# Patient Record
Sex: Female | Born: 1985 | Hispanic: Yes | Marital: Single | Smoking: Never smoker
Health system: Southern US, Community
[De-identification: ages and names within clinical notes are randomized; demographics above are authoritative.]

## PROBLEM LIST (undated history)

## (undated) DIAGNOSIS — R030 Elevated blood-pressure reading, without diagnosis of hypertension: Secondary | ICD-10-CM

## (undated) HISTORY — DX: Elevated blood-pressure reading, without diagnosis of hypertension: R03.0

---

## 2016-02-16 ENCOUNTER — Other Ambulatory Visit: Payer: Self-pay

## 2016-05-28 LAB — OB RESULTS CONSOLE ANTIBODY SCREEN: ANTIBODY SCREEN: NEGATIVE

## 2016-05-28 LAB — OB RESULTS CONSOLE HGB/HCT, BLOOD
HEMATOCRIT: 41 %
Hemoglobin: 13.4 g/dL

## 2016-05-28 LAB — OB RESULTS CONSOLE ABO/RH: RH Type: POSITIVE

## 2016-05-28 LAB — OB RESULTS CONSOLE GC/CHLAMYDIA
Chlamydia: NEGATIVE
GC PROBE AMP, GENITAL: NEGATIVE

## 2016-05-28 LAB — OB RESULTS CONSOLE RPR: RPR: NONREACTIVE

## 2016-05-28 LAB — OB RESULTS CONSOLE HEPATITIS B SURFACE ANTIGEN: Hepatitis B Surface Ag: NEGATIVE

## 2016-05-28 LAB — OB RESULTS CONSOLE PLATELET COUNT: PLATELETS: 290 10*3/uL

## 2016-05-28 LAB — OB RESULTS CONSOLE VARICELLA ZOSTER ANTIBODY, IGG: VARICELLA IGG: IMMUNE

## 2016-05-28 LAB — OB RESULTS CONSOLE HIV ANTIBODY (ROUTINE TESTING): HIV: NONREACTIVE

## 2016-05-28 LAB — OB RESULTS CONSOLE RUBELLA ANTIBODY, IGM: RUBELLA: IMMUNE

## 2016-05-30 ENCOUNTER — Ambulatory Visit (HOSPITAL_COMMUNITY)
Admission: RE | Admit: 2016-05-30 | Discharge: 2016-05-30 | Disposition: A | Discharge status: Home / Self Care | Payer: Self-pay | Source: Ambulatory Visit | Attending: Nurse Practitioner | Admitting: Nurse Practitioner

## 2016-05-30 DIAGNOSIS — Z3A14 14 weeks gestation of pregnancy: Secondary | ICD-10-CM | POA: Insufficient documentation

## 2016-05-30 DIAGNOSIS — O352XX0 Maternal care for (suspected) hereditary disease in fetus, not applicable or unspecified: Secondary | ICD-10-CM | POA: Insufficient documentation

## 2016-05-30 NOTE — Progress Notes (Signed)
Genetic Counseling  Visit Summary Note  Appointment Date: 05/30/2016 Referred By: Shelbie Ammons, NP  Date of Birth: 10/29/1985  I met with Ms. Alexandria [name] for genetic counseling because of family history concerns.  Alexandria Roach, Alexandria Roach interpreter, provided Spanish/English translation.  In summary:  Discussed family history of a brother that was non-verbal and passed away at 46  Without a specific diagnosis a specific recurrence chance can not be determined  Without a specific diagnosis accurate testing for her pregnancies is not an option  Discussed general population carrier screening options - offered at primary OB's office  CF  SMA  Hemoglobinopathies  We began by reviewing the family history in detail.  Ms. Alexandria Roach reported that she has 5 sisters and 3 brothers who all share the same parents.  Her five sisters are all married and have many children.  There are no known health concerns for any of her nieces or nephews.  Her brothers do not yet have children.  One brother passed away at the age of 106.  She reports that he was non-dysmorphic (she said he did not appear different from her other siblings) and had no known health problems.  He was the 7th child born to her parents.  He went to school with her and her siblings and he was able to understand all that was asked of him.  He took care of his own needs (feeding himself, dressing, etc.) but he did not ever say words.  He could make sounds, but did not talk.  She said he could not read, but he could write.  When asked if she had heard of Down syndrome, she stated that she had not heard of that condition.  When asked if her brother had a diagnosis of Down syndrome or another condition, she stated that he did not have a diagnosis other than that he didn't talk.  She stated that she brought him to the hospital one day because he refused to get dressed and wanted to be naked, and this was not typical behavior for him.   He stayed at the hospital and then the doctor said he was fine and sent him home but they never said what was wrong with him.  He died 19 days later.  She had no more information on the cause or nature of his death.  While some of her description suggests her brother may have had autism, persons with autism do not typically have a shortened life expectancy.    We discussed that it is difficult to know what the chance is for another child to have differences similar to her brother's, when we don't have an etiology for his differences.  We discussed genetic etiologies and environmental etiologies.  We discussed various patterns of inheritance as well as new genetic changes.  Most of the genetic etiologies would be unlikely, given the numerous other individuals in the family with no differences similar to her brother's.  For example, if this were X-linked, it would be likely that one of her nephews would have a similar difference, or if it were recessive or dominant, that more than one sibling would be impacted. If this were to have been due to an exposure during her mother's pregnancy with her brother, we would not expect an increased chance for a similar condition in Ms. Alexandria Roach' children.  We discussed that given the rest of her family history, it is unlikely that she would have a child with a condition similar  to her brother.  However, without a specific etiology, we can not state that there is no risk.  She was counseled that without knowing how to identify the condition, we could not offer screening for a similar condition in her pregnancy.    Ms. Alexandria Roach' family history was otherwise found to be noncontributory for birth defects, mental retardation, and known genetic conditions. Ms. Alexandria Roach was not familiar with the father of the baby's family history and states that he is no longer involved with the pregnancy.  We, therefore, cannot comment on how his history might contribute to the  overall chance for the baby to have a birth defect.  Without further information regarding the provided family history, an accurate genetic risk cannot be calculated. Further genetic counseling is warranted if more information is obtained.  Ms. Alexandria Roach was provided with written information regarding cystic fibrosis (CF), spinal muscular atrophy (SMA) and hemoglobinopathies including the carrier frequency, availability of carrier screening and prenatal diagnosis if indicated.  In addition, we discussed that carrier screening for CF and hemoglobinopathies was drawn at her primary Ob's office and are routinely screened for as part of the [facility] newborn screening panel.  After further discussion, she declined screening today.  Ms. Alexandria Roach denied exposure to environmental toxins or chemical agents. She denied the use of alcohol, tobacco or street drugs. She denied significant viral illnesses during the course of her pregnancy. Her medical and surgical histories were noncontributory.   I counseled Ms. [name] regarding the above risks and available options.  The approximate face-to-face time with the genetic counselor was 30 minutes.  Cam Hai, MS Certified Genetic Counselor

## 2016-07-25 ENCOUNTER — Encounter: Payer: Self-pay | Admitting: Obstetrics and Gynecology

## 2016-07-25 ENCOUNTER — Ambulatory Visit (INDEPENDENT_AMBULATORY_CARE_PROVIDER_SITE_OTHER): Payer: Self-pay | Admitting: Obstetrics and Gynecology

## 2016-07-25 DIAGNOSIS — Z3482 Encounter for supervision of other normal pregnancy, second trimester: Secondary | ICD-10-CM

## 2016-07-25 DIAGNOSIS — Z3480 Encounter for supervision of other normal pregnancy, unspecified trimester: Secondary | ICD-10-CM

## 2016-07-25 NOTE — Progress Notes (Signed)
   PRENATAL VISIT NOTE  Subjective:  Alexandria Roach is a 31 y.o. [phone] at 2648w2d being seen today for ongoing prenatal care.  She is currently monitored for the following issues for this low-risk pregnancy and has [redacted] weeks gestation of pregnancy; Hereditary disease in family possibly affecting fetus, affecting management of mother, antepartum condition or complication, not applicable or unspecified fetus; and Supervision of other normal pregnancy, antepartum on her problem list.  Patient reports no complaints.  Contractions: Not present. Vag. Bleeding: None.  Movement: Present. Denies leaking of fluid.   The following portions of the patient's history were reviewed and updated as appropriate: allergies, current medications, past family history, past medical history, past social history, past surgical history and problem list. Problem list updated.  Objective:   Vitals:   07/25/16 0920  BP: 125/72  Pulse: 79  Weight: 170 lb (77.1 kg)    Fetal Status: Fetal Heart Rate (bpm): 146 Fundal Height: 21 cm Movement: Present     General:  Alert, oriented and cooperative. Patient is in no acute distress.  Skin: Skin is warm and dry. No rash noted.   Cardiovascular: Normal heart rate noted  Respiratory: Normal respiratory effort, no problems with respiration noted  Abdomen: Soft, gravid, appropriate for gestational age. Pain/Pressure: Absent     Pelvic:  Cervical exam deferred        Extremities: Normal range of motion.  Edema: None  Mental Status: Normal mood and affect. Normal behavior. Normal judgment and thought content.   Assessment and Plan:  Pregnancy: G4P3003 at 3048w2d  1. Supervision of other normal pregnancy, antepartum Patient [facility] due to one elevated BP (140/74) at initial ob. All other BP normal. Patient denies a history of HTN.  Ultrasound results reviewed with the patient with a normal anatomy but EFW 3.3%tile. Will schedule follow up  growth ultrasound week of May 21  Preterm labor symptoms and general obstetric precautions including but not limited to vaginal bleeding, contractions, leaking of fluid and fetal movement were reviewed in detail with the patient. Please refer to After Visit Summary for other counseling recommendations.  No Follow-up on file.   Jossie Smoot, Gigi GinPeggy, MD

## 2016-08-11 ENCOUNTER — Encounter: Payer: Self-pay | Admitting: Obstetrics and Gynecology

## 2016-08-20 ENCOUNTER — Ambulatory Visit (INDEPENDENT_AMBULATORY_CARE_PROVIDER_SITE_OTHER): Payer: Self-pay | Admitting: Obstetrics & Gynecology

## 2016-08-20 ENCOUNTER — Telehealth: Payer: Self-pay | Admitting: Obstetrics & Gynecology

## 2016-08-20 VITALS — BP 123/66 | HR 85 | Wt 169.8 lb

## 2016-08-20 DIAGNOSIS — Z3480 Encounter for supervision of other normal pregnancy, unspecified trimester: Secondary | ICD-10-CM

## 2016-08-20 DIAGNOSIS — O0992 Supervision of high risk pregnancy, unspecified, second trimester: Secondary | ICD-10-CM

## 2016-08-20 DIAGNOSIS — Z3689 Encounter for other specified antenatal screening: Secondary | ICD-10-CM

## 2016-08-20 DIAGNOSIS — O0990 Supervision of high risk pregnancy, unspecified, unspecified trimester: Secondary | ICD-10-CM

## 2016-08-20 DIAGNOSIS — O352XX0 Maternal care for (suspected) hereditary disease in fetus, not applicable or unspecified: Secondary | ICD-10-CM

## 2016-08-20 MED ORDER — CETIRIZINE HCL 10 MG PO TABS: 10.0000 mg | ORAL_TABLET | Freq: Every day | ORAL | 1 refills | Status: AC

## 2016-08-20 MED ORDER — TETANUS-DIPHTH-ACELL PERTUSSIS 5-2.5-18.5 LF-MCG/0.5 IM SUSP
0.5000 mL | Freq: Once | INTRAMUSCULAR | Status: AC
  Administered 2016-08-20: 0.5 mL via INTRAMUSCULAR

## 2016-08-20 NOTE — Progress Notes (Signed)
OB US scheduled for June 8th @ 1545.  Pt notified with Spanish interpreter [place].

## 2016-08-20 NOTE — Progress Notes (Signed)
   Dating Criteria: Methods for Estimating the Due Date     PRENATAL VISIT NOTE  Subjective:  Alexandria Roach is a 31 y.o. [phone] at 7941w5d being seen today for ongoing prenatal care.  She is currently monitored for the following issues for this low-risk pregnancy and has Hereditary disease in family possibly affecting fetus, affecting management of mother, antepartum condition or complication, not applicable or unspecified fetus and Supervision of other normal pregnancy, antepartum on her problem list.  Patient reports scratchy throat, sneezing.  Contractions: Not present. Vag. Bleeding: None.  Movement: Present. Denies leaking of fluid.   The following portions of the patient's history were reviewed and updated as appropriate: allergies, current medications, past family history, past medical history, past social history, past surgical history and problem list. Problem list updated.  Objective:   Vitals:   08/20/16 0934  BP: 123/66  Pulse: 85  Weight: 169 lb 12.8 oz (77 kg)    Fetal Status: Fetal Heart Rate (bpm): 159 Fundal Height: 24 cm Movement: Present     General:  Alert, oriented and cooperative. Patient is in no acute distress.  Skin: Skin is warm and dry. No rash noted.   Cardiovascular: Normal heart rate noted  Respiratory: Normal respiratory effort, no problems with respiration noted  Abdomen: Soft, gravid, appropriate for gestational age. Pain/Pressure: Absent     Pelvic:  Cervical exam deferred        Extremities: Normal range of motion.  Edema: None  Mental Status: Normal mood and affect. Normal behavior. Normal judgment and thought content.   Assessment and Plan:  Pregnancy: G4P3003 at 4341w5d  1. Supervision of high risk pregnancy, antepartum  - Glucose Tolerance, 2 Hours w/1 Hour - RPR - CBC - HIV antibody (with reflex)  2. Hereditary disease in family possibly affecting fetus, affecting management of mother, antepartum condition or complication, not  applicable or unspecified fetus - Refused genetic testing other than quad  3. Supervision of other normal pregnancy, antepartum -Pt had nexplanon removed 3 weeks prior to LMP.  I would not use LMP as correct dating.  The 18 week [facility] is current best dating.  Pt was supposed to get a fu US at [facility] but did not go.  Another US was ordered and will hopefully be completed before she moves.  Pt knows to have new MD request records.  .    4.  Seasonal allergies -Generic Zyrtec   Preterm labor symptoms and general obstetric precautions including but not limited to vaginal bleeding, contractions, leaking of fluid and fetal movement were reviewed in detail with the patient. Please refer to After Visit Summary for other counseling recommendations.  Return in about 3 weeks (around 09/10/2016).   Elsie LincolnKelly Givanni Staron, MD

## 2016-08-20 NOTE — Telephone Encounter (Signed)
Patient moving to [place] 08/27/2016, and will find ob to transfer care. Printing patient's records for her to take to new provider.

## 2016-08-21 LAB — CBC
HEMATOCRIT: 35.7 % (ref 34.0–46.6)
Hemoglobin: 11.4 g/dL (ref 11.1–15.9)
MCH: 25.9 pg — ABNORMAL LOW (ref 26.6–33.0)
MCHC: 31.9 g/dL (ref 31.5–35.7)
MCV: 81 fL (ref 79–97)
PLATELETS: 324 10*3/uL (ref 150–379)
RBC: 4.4 x10E6/uL (ref 3.77–5.28)
RDW: 14.1 % (ref 12.3–15.4)
WBC: 13.1 10*3/uL — ABNORMAL HIGH (ref 3.4–10.8)

## 2016-08-21 LAB — RPR: RPR: NONREACTIVE

## 2016-08-21 LAB — GLUCOSE TOLERANCE, 2 HOURS W/ 1HR
GLUCOSE, 1 HOUR: 234 mg/dL — AB (ref 65–179)
GLUCOSE, 2 HOUR: 186 mg/dL — AB (ref 65–152)
GLUCOSE, FASTING: 109 mg/dL — AB (ref 65–91)

## 2016-08-21 LAB — HIV ANTIBODY (ROUTINE TESTING W REFLEX): HIV Screen 4th Generation wRfx: NONREACTIVE

## 2016-08-22 ENCOUNTER — Encounter: Payer: Self-pay | Admitting: Obstetrics & Gynecology

## 2016-08-22 DIAGNOSIS — O24419 Gestational diabetes mellitus in pregnancy, unspecified control: Secondary | ICD-10-CM | POA: Insufficient documentation

## 2016-08-23 ENCOUNTER — Other Ambulatory Visit: Payer: Self-pay | Admitting: General Practice

## 2016-08-23 ENCOUNTER — Telehealth: Payer: Self-pay

## 2016-08-23 DIAGNOSIS — O24419 Gestational diabetes mellitus in pregnancy, unspecified control: Secondary | ICD-10-CM

## 2016-08-23 NOTE — Telephone Encounter (Signed)
Pt scheduled for Diabetes Ed on June 14th @ 1400.  Attempted to contact pt in regards to abnormal glucose results and the appt schedule for education.  Unable to LM due to VM box not set up.

## 2016-08-23 NOTE — Telephone Encounter (Signed)
-----   Message from Lesly DukesKelly H Leggett, MD sent at 08/22/2016  9:16 AM EDT ----- Refer to diabetes management

## 2016-08-23 NOTE — Telephone Encounter (Signed)
Attempted to contact pt.  Unable to LM due to VM box not set up.   Contacted [facility] and requested that they inform pt of her Diabetes Ed appt scheduled in [facility] on June 14th.  Was informed that [facility]FM will notify the pt at her US appt on 08/24/16.

## 2016-08-24 ENCOUNTER — Other Ambulatory Visit: Payer: Self-pay | Admitting: Obstetrics & Gynecology

## 2016-08-24 ENCOUNTER — Ambulatory Visit (HOSPITAL_COMMUNITY)
Admission: RE | Admit: 2016-08-24 | Discharge: 2016-08-24 | Disposition: A | Discharge status: Home / Self Care | Payer: Self-pay | Source: Ambulatory Visit | Attending: Obstetrics & Gynecology | Admitting: Obstetrics & Gynecology

## 2016-08-24 DIAGNOSIS — Z3689 Encounter for other specified antenatal screening: Secondary | ICD-10-CM

## 2016-08-24 DIAGNOSIS — Z3A25 25 weeks gestation of pregnancy: Secondary | ICD-10-CM

## 2016-08-24 DIAGNOSIS — O24419 Gestational diabetes mellitus in pregnancy, unspecified control: Secondary | ICD-10-CM

## 2016-08-30 ENCOUNTER — Ambulatory Visit (HOSPITAL_COMMUNITY): Payer: Self-pay

## 2016-09-14 ENCOUNTER — Encounter: Payer: Self-pay | Admitting: Family Medicine

## 2016-09-15 ENCOUNTER — Encounter: Payer: Self-pay | Admitting: Family Medicine

## 2016-09-15 NOTE — Progress Notes (Signed)
Patient did not keep appointment today--she will be called to reschedule.

## 2016-09-17 ENCOUNTER — Encounter: Payer: Self-pay | Admitting: General Practice

## 2016-09-17 ENCOUNTER — Telehealth: Payer: Self-pay | Admitting: General Practice

## 2016-09-17 NOTE — Telephone Encounter (Signed)
Received message from Dr. Shawn[facility] PonsPratt [facility].  Attempted to call patient, but their was no answer and was unable to leave message.  [place].  Certified letter was mailed to patient.  [facility]

## 2017-02-27 ENCOUNTER — Encounter (HOSPITAL_COMMUNITY): Payer: Self-pay

## 2017-12-31 ENCOUNTER — Encounter: Payer: Self-pay | Admitting: *Deleted

## 2018-04-29 IMAGING — US US MFM OB DETAIL+14 WK
1 series · 14 of 28 positions shown · non-contrast
Comparison: none

[Series 1: us mfm ob detail+14 wk · 14 of 56 slices shown]
[im 3/56]
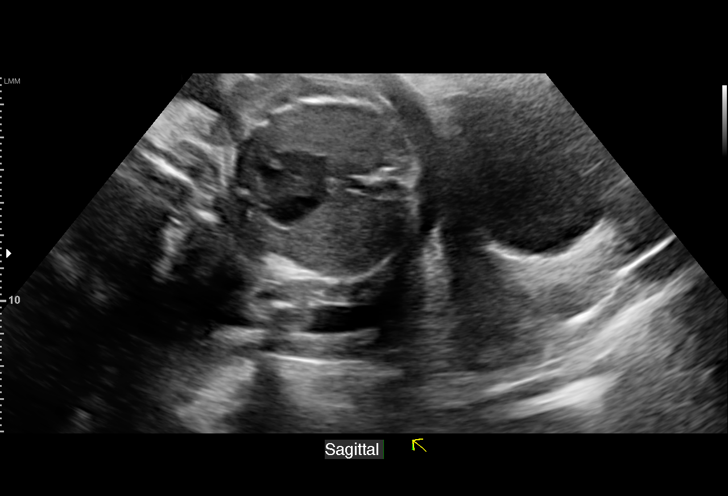
[im 7/56]
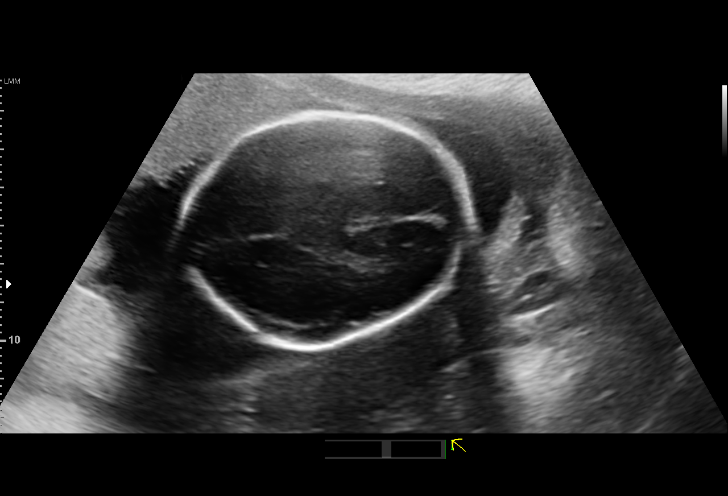
[im 11/56]
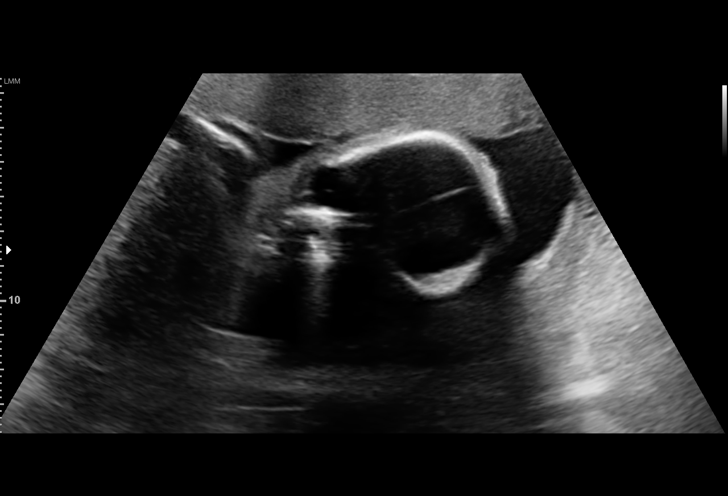
[im 15/56]
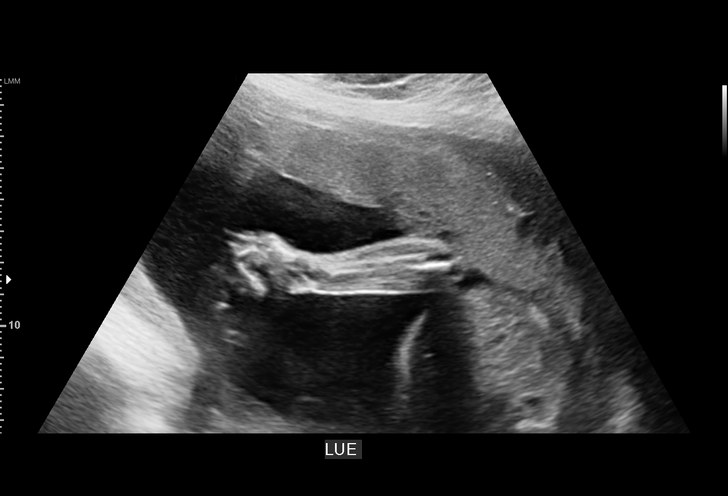
[im 19/56]
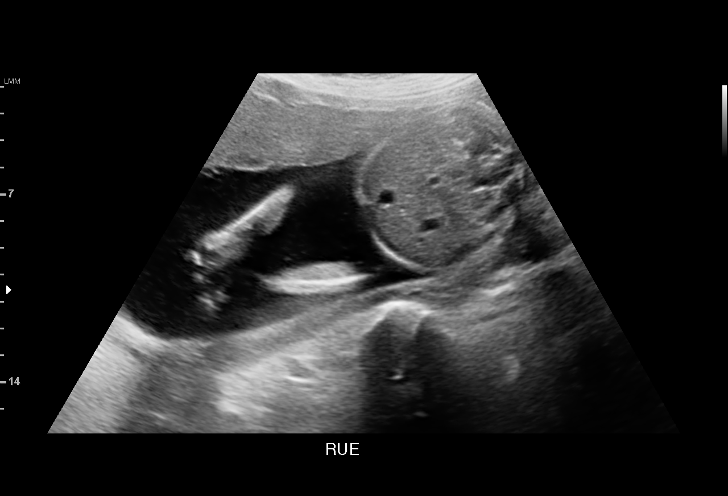
[im 23/56]
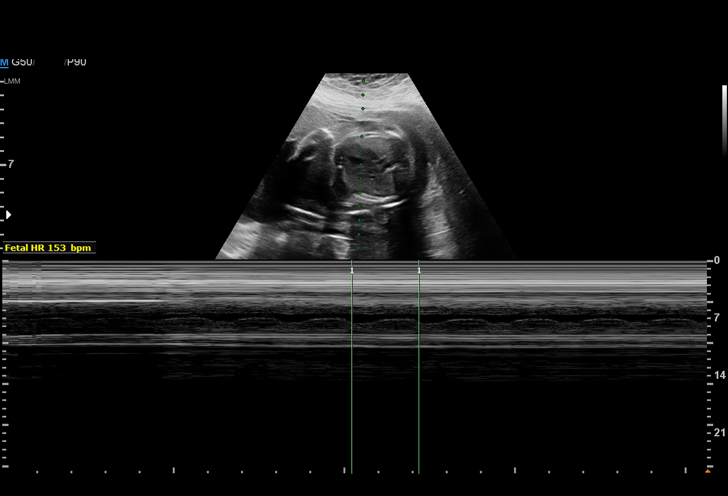
[im 27/56]
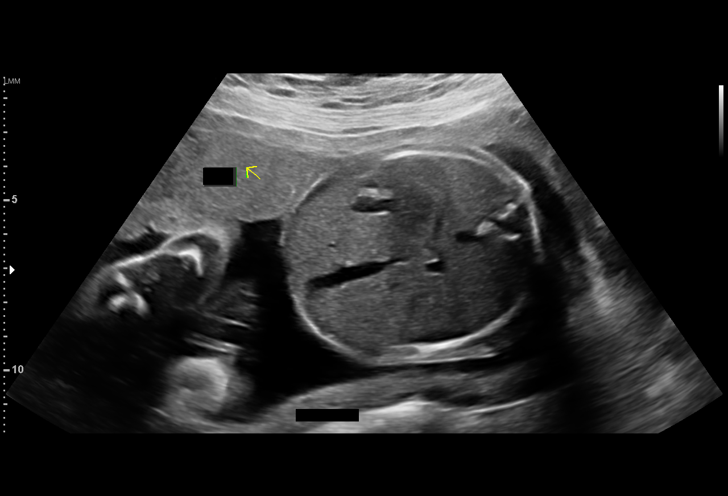
[im 31/56]
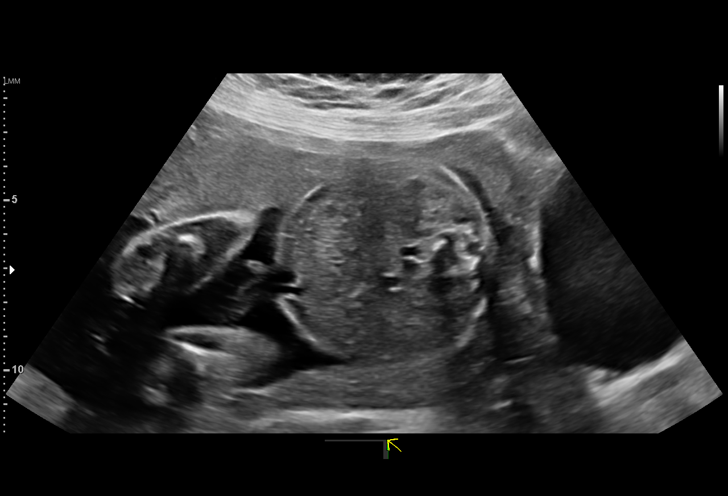
[im 35/56]
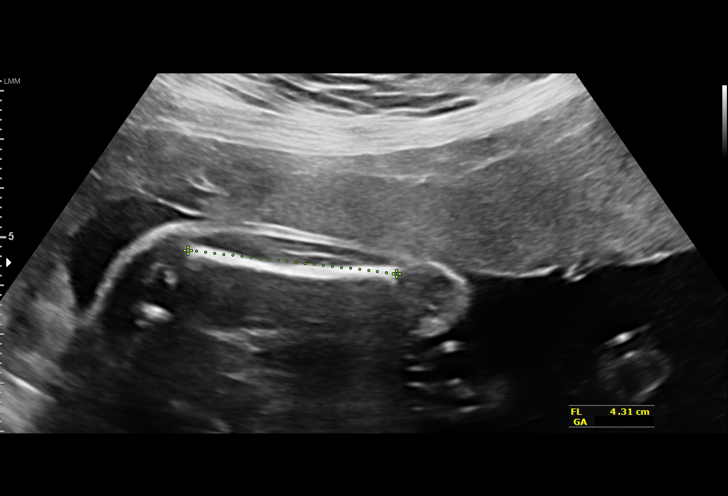
[im 39/56]
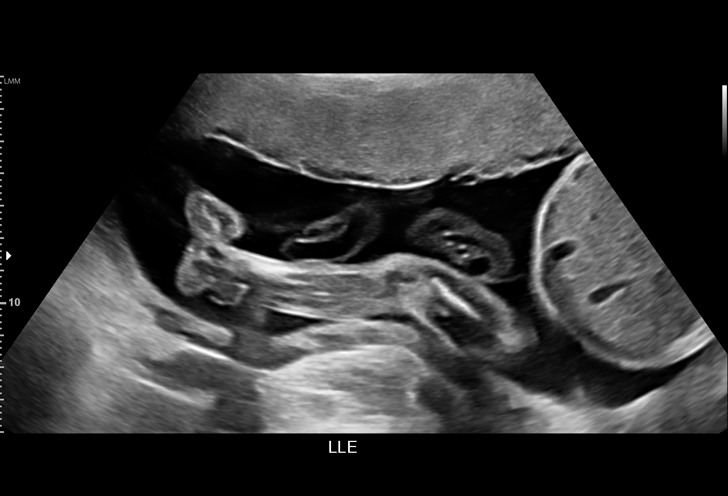
[im 43/56]
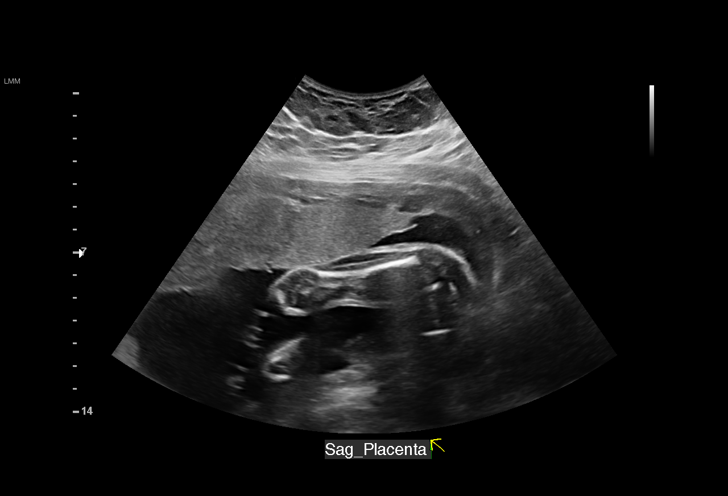
[im 47/56]
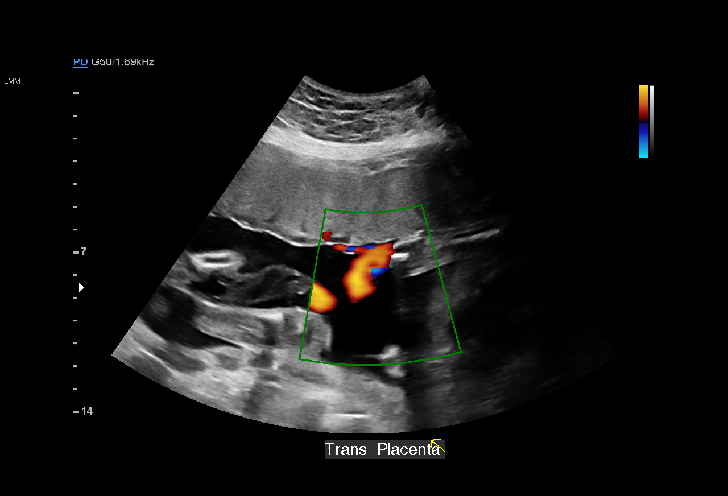
[im 51/56]
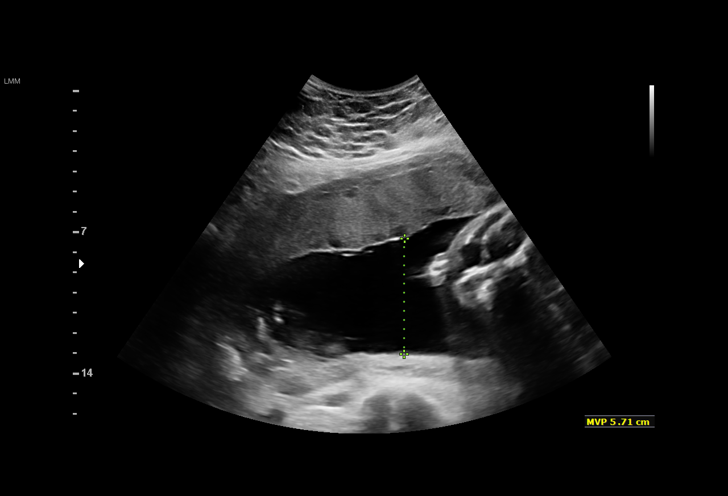
[im 56/56]
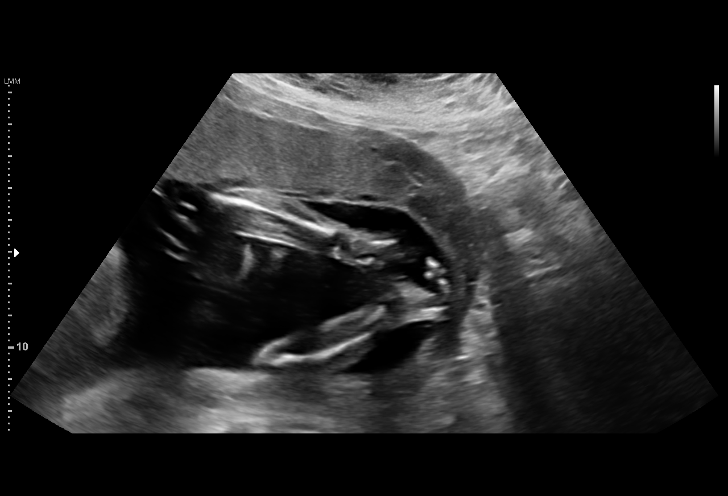

[14 of 28 positions shown; findings below may reference images not displayed]

[REDACTED]

1  PICHAY            [id number]      [id number]     [id number]
Indications

25 weeks gestation of pregnancy
Encounter for fetal anatomic survey
Gestational diabetes in pregnancy,
unspecified control
OB History

Gravidity:    4         Term:   3
Living:       3
Fetal Evaluation

Num Of Fetuses:     1
Fetal Heart         153
Rate(bpm):
Cardiac Activity:   Observed
Presentation:       Transverse, head to maternal left
Placenta:           Anterior, above cervical os
P. Cord Insertion:  Visualized, central

Amniotic Fluid
AFI FV:      Subjectively within normal limits

Largest Pocket(cm)
5.71
Biometry

BPD:        60  mm     G. Age:  24w 4d         16  %    CI:        76.03   %   70 - 86
FL/HC:      19.9   %   18.7 -
HC:      218.1  mm     G. Age:  23w 6d        < 3  %    HC/AC:      1.01       1.04 -
AC:      215.9  mm     G. Age:  26w 1d         66  %    FL/BPD:     72.2   %   71 - 87
FL:       43.3  mm     G. Age:  24w 2d         10  %    FL/AC:      20.1   %   20 - 24
HUM:      40.9  mm     G. Age:  24w 6d         31  %
CER:      27.5  mm     G. Age:  24w 5d         38  %

CM:        7.6  mm
Est. FW:     769  gm    1 lb 11 oz      49  %
Gestational Age

LMP:           26w 4d       Date:   02/20/16                 EDD:   11/26/16
U/S Today:     24w 5d                                        EDD:   12/09/16
Best:          25w 2d    Det. By:   Previous Ultrasound      EDD:   12/05/16
(07/09/16)
Anatomy

Cranium:               Appears normal         Aortic Arch:            Not well visualized
Cavum:                 Appears normal         Ductal Arch:            Not well visualized
Ventricles:            Appears normal         Diaphragm:              Not well visualized
Choroid Plexus:        Appears normal         Stomach:                Appears normal, left
sided
Cerebellum:            Appears normal         Abdomen:                Appears normal
Posterior Fossa:       Appears normal         Abdominal Wall:         Appears nml (cord
insert, abd wall)
Nuchal Fold:           Not applicable (>20    Cord Vessels:           Appears normal (3
wks GA)                                        vessel cord)
Face:                  Not well visualized    Kidneys:                Appear normal
Lips:                  Appears normal         Bladder:                Appears normal
Thoracic:              Appears normal         Spine:                  Not well visualized
Heart:                 Appears normal         Upper Extremities:      Appears normal
(4CH, axis, and situs
RVOT:                  Not well visualized    Lower Extremities:      Appears normal
LVOT:                  Not well visualized

Other:  Fetus appears to be a female. Technically difficult due to maternal
habitus and fetal position.
Cervix Uterus Adnexa

Cervix
Length:            4.5  cm.
Normal appearance by transabdominal scan.

Uterus
No abnormality visualized.

Left Ovary
Not visualized.

Right Ovary
Not visualized.

Cul De Sac:   No free fluid seen.
Impression

Single living intrauterine pregnancy at 31w3d.
Transverse presentation.
Anterior placenta without evidence of previa.
Appropriate fetal growth (49%).
Normal amniotic fluid volume.
The fetal anatomic survey is not complete.
No gross fetal anomalies identified.
Recommendations

Recommend follow-up ultrasound examination in 4-6 weeks
for completion of fetal anatomic survey.
The patient is moving out of state and will follow-up with new
provider there.

## 2018-07-17 ENCOUNTER — Encounter: Payer: Self-pay | Admitting: *Deleted
